# Patient Record
Sex: Male | Born: 1990 | Race: Black or African American | Hispanic: No | Marital: Single | Smoking: Never smoker
Health system: Southern US, Community
[De-identification: ages and names within clinical notes are randomized; demographics above are authoritative.]

## PROBLEM LIST (undated history)

## (undated) DIAGNOSIS — Z973 Presence of spectacles and contact lenses: Secondary | ICD-10-CM

## (undated) HISTORY — PX: TONSILLECTOMY: SUR1361

## (undated) HISTORY — DX: Presence of spectacles and contact lenses: Z97.3

---
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
## 2009-06-26 ENCOUNTER — Emergency Department (HOSPITAL_COMMUNITY): Admission: EM | Admit: 2009-06-26 | Discharge: 2009-06-26 | Payer: Self-pay | Admitting: Emergency Medicine

## 2012-06-07 ENCOUNTER — Encounter: Payer: Self-pay | Admitting: Family Medicine

## 2012-06-08 ENCOUNTER — Encounter: Payer: Self-pay | Admitting: Family Medicine

## 2012-06-08 ENCOUNTER — Ambulatory Visit (INDEPENDENT_AMBULATORY_CARE_PROVIDER_SITE_OTHER): Payer: BC Managed Care – PPO | Admitting: Family Medicine

## 2012-06-08 VITALS — BP 120/78 | HR 56 | Ht 74.5 in | Wt 178.0 lb

## 2012-06-08 DIAGNOSIS — J309 Allergic rhinitis, unspecified: Secondary | ICD-10-CM

## 2012-06-08 DIAGNOSIS — Z23 Encounter for immunization: Secondary | ICD-10-CM

## 2012-06-08 DIAGNOSIS — Z0000 Encounter for general adult medical examination without abnormal findings: Secondary | ICD-10-CM

## 2012-06-08 LAB — POCT URINALYSIS DIPSTICK
Blood, UA: NEGATIVE
Glucose, UA: NEGATIVE
Spec Grav, UA: 1.015
Urobilinogen, UA: NEGATIVE
pH, UA: 5

## 2012-06-08 NOTE — Progress Notes (Signed)
  Subjective:    Patient ID: Alexander Mayer, male    DOB: [birth year], 21 y.o.   MRN: [id number]  HPI He is here for examination prior to going to [facility] and to play basketball. 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 His past medical history is negative except for allergies. There is a questionable history of sickle cell trait however previous records have no data on this. He's had no difficulty with chest pain, he related problems, passing out. He has no questions or concerns. He does not smoke. He is sexually active and does use condoms.  Review of Systems Negative except as above    Objective:   Physical Exam alert and in no distress. Tympanic membranes and canals are normal. Throat is clear. Tonsils are normal. Neck is supple without adenopathy or thyromegaly. Cardiac exam shows a regular sinus rhythm without murmurs or gallops. Lungs are clear to auscultation. Abdominal exam shows no masses or tenderness. Genitalia normal with no hernia        Assessment & Plan:   1. Routine general medical examination at a health care facility  POCT Urinalysis Dipstick  2. Immunization due  Tdap vaccine greater than or equal to 7yo IM  3. Allergic rhinitis     sickle test ordered. I will be sent to history and her when we get it. I encouraged him to get tested for EIA.

## 2012-06-08 NOTE — Patient Instructions (Signed)
Call back with the fax number and we will send the sickle cell information. Talked to your trainer about getting tested for exercise-induced asthma

## 2012-06-08 NOTE — Addendum Note (Signed)
Addended by: Ronnald Nian on: 06/08/2012 12:20 PM   Modules accepted: Orders

## 2012-07-31 ENCOUNTER — Telehealth: Payer: Self-pay | Admitting: Family Medicine

## 2012-07-31 NOTE — Telephone Encounter (Signed)
FAX NUMBER SHOULD BE [phone] WILL RESEND DT

## 2012-07-31 NOTE — Telephone Encounter (Signed)
Pt called and wants [place] to refax sickle cell report to the college [phone] he will check and see if there is a different fax number and call us back

## 2013-05-06 ENCOUNTER — Emergency Department (HOSPITAL_COMMUNITY): Payer: BC Managed Care – PPO

## 2013-05-06 ENCOUNTER — Emergency Department (HOSPITAL_COMMUNITY)
Admission: EM | Admit: 2013-05-06 | Discharge: 2013-05-06 | Disposition: A | Discharge status: Home / Self Care | Payer: BC Managed Care – PPO | Attending: Emergency Medicine | Admitting: Emergency Medicine

## 2013-05-06 ENCOUNTER — Telehealth (HOSPITAL_COMMUNITY): Payer: Self-pay | Admitting: Emergency Medicine

## 2013-05-06 ENCOUNTER — Encounter (HOSPITAL_COMMUNITY): Payer: Self-pay | Admitting: Emergency Medicine

## 2013-05-06 DIAGNOSIS — R109 Unspecified abdominal pain: Secondary | ICD-10-CM | POA: Insufficient documentation

## 2013-05-06 DIAGNOSIS — K529 Noninfective gastroenteritis and colitis, unspecified: Secondary | ICD-10-CM

## 2013-05-06 DIAGNOSIS — R1031 Right lower quadrant pain: Secondary | ICD-10-CM

## 2013-05-06 DIAGNOSIS — K5289 Other specified noninfective gastroenteritis and colitis: Secondary | ICD-10-CM | POA: Insufficient documentation

## 2013-05-06 LAB — URINALYSIS, ROUTINE W REFLEX MICROSCOPIC
Glucose, UA: NEGATIVE mg/dL
Hgb urine dipstick: NEGATIVE
Ketones, ur: NEGATIVE mg/dL
Leukocytes, UA: NEGATIVE
pH: 5.5 (ref 5.0–8.0)

## 2013-05-06 LAB — CBC WITH DIFFERENTIAL/PLATELET
Basophils Absolute: 0 10*3/uL (ref 0.0–0.1)
Eosinophils Absolute: 0.1 10*3/uL (ref 0.0–0.7)
Eosinophils Relative: 1 % (ref 0–5)
HCT: 44.2 % (ref 39.0–52.0)
MCH: 27.4 pg (ref 26.0–34.0)
MCV: 81.7 fL (ref 78.0–100.0)
Monocytes Absolute: 1.3 10*3/uL — ABNORMAL HIGH (ref 0.1–1.0)
Platelets: 208 10*3/uL (ref 150–400)
RDW: 12.1 % (ref 11.5–15.5)

## 2013-05-06 LAB — LIPASE, BLOOD: Lipase: 13 U/L (ref 11–59)

## 2013-05-06 LAB — COMPREHENSIVE METABOLIC PANEL
ALT: 6 U/L (ref 0–53)
AST: 12 U/L (ref 0–37)
Albumin: 4.2 g/dL (ref 3.5–5.2)
Alkaline Phosphatase: 56 U/L (ref 39–117)
Chloride: 101 mEq/L (ref 96–112)
Potassium: 4 mEq/L (ref 3.5–5.1)
Sodium: 139 mEq/L (ref 135–145)
Total Bilirubin: 1 mg/dL (ref 0.3–1.2)

## 2013-05-06 MED ORDER — CIPROFLOXACIN HCL 500 MG PO TABS: 500.0000 mg | ORAL_TABLET | Freq: Two times a day (BID) | ORAL | Status: DC

## 2013-05-06 MED ORDER — IOHEXOL 300 MG/ML  SOLN
100.0000 mL | Freq: Once | INTRAMUSCULAR | Status: AC | PRN
  Administered 2013-05-06: 100 mL via INTRAVENOUS

## 2013-05-06 MED ORDER — SODIUM CHLORIDE 0.9 % IV BOLUS (SEPSIS)
1000.0000 mL | Freq: Once | INTRAVENOUS | Status: AC
  Administered 2013-05-06: 1000 mL via INTRAVENOUS

## 2013-05-06 MED ORDER — IOHEXOL 300 MG/ML  SOLN
50.0000 mL | Freq: Once | INTRAMUSCULAR | Status: AC | PRN
  Administered 2013-05-06: 50 mL via ORAL

## 2013-05-06 MED ORDER — METRONIDAZOLE 500 MG PO TABS: 500.0000 mg | ORAL_TABLET | Freq: Three times a day (TID) | ORAL | Status: DC

## 2013-05-06 NOTE — ED Notes (Signed)
Pt finished with contrast.

## 2013-05-06 NOTE — ED Notes (Signed)
Pt aware of the need for a urine sample, urinal at bedside. 

## 2013-05-06 NOTE — ED Notes (Signed)
Pt c/o right side lower abdominal pain, denies n/v/d.

## 2013-05-06 NOTE — ED Provider Notes (Signed)
   History    CSN: [id number] Arrival date & time 05/06/13  1005  First MD Initiated Contact with Patient 05/06/13 1016     Chief Complaint  Patient presents with  . Abdominal Pain   (Consider location/radiation/quality/duration/timing/severity/associated sxs/prior Treatment) HPI Comments: 22 yo male with no medical hx, no smoking, no etoh present with RLQ pain.  Started centrally yesterday and now is right lower flank.  FH kidney stones.  Constant.  Mild.  Nothing worsens.  Last meal yesterday.  Ache.    Patient is a 22 y.o. male presenting with abdominal pain. The history is provided by the patient.  Abdominal Pain Associated symptoms include abdominal pain. Pertinent negatives include no chest pain, no headaches and no shortness of breath.   History reviewed. No pertinent past medical history. History reviewed. No pertinent past surgical history. No family history on file. History  Substance Use Topics  . Smoking status: Never Smoker   . Smokeless tobacco: Never Used  . Alcohol Use: No    Review of Systems  Constitutional: Negative for fever and chills.  HENT: Negative for neck pain and neck stiffness.   Eyes: Negative for visual disturbance.  Respiratory: Negative for shortness of breath.   Cardiovascular: Negative for chest pain.  Gastrointestinal: Positive for abdominal pain. Negative for vomiting.  Genitourinary: Positive for flank pain. Negative for dysuria.  Musculoskeletal: Negative for back pain.  Skin: Negative for rash.  Neurological: Negative for light-headedness and headaches.    Allergies  Review of patient's allergies indicates no known allergies.  Home Medications  No current outpatient prescriptions on file. BP 140/71  Pulse 71  Temp(Src) 98.8 F (37.1 C) (Oral)  Resp 20  SpO2 99% Physical Exam  Nursing note and vitals reviewed. Constitutional: He is oriented to person, place, and time. He appears well-developed and well-nourished.  HENT:   Head: Normocephalic and atraumatic.  Eyes: Conjunctivae are normal. Right eye exhibits no discharge. Left eye exhibits no discharge.  Neck: Normal range of motion. Neck supple. No tracheal deviation present.  Cardiovascular: Normal rate and regular rhythm.   Pulmonary/Chest: Effort normal and breath sounds normal.  Abdominal: Soft. He exhibits no distension. There is tenderness (mild right lower flank/ RLQ). There is no guarding.  Musculoskeletal: He exhibits no edema.  Neurological: He is alert and oriented to person, place, and time.  Skin: Skin is warm. No rash noted.  Psychiatric: He has a normal mood and affect.    ED Course  Procedures (including critical care time) Labs Reviewed  URINALYSIS, ROUTINE W REFLEX MICROSCOPIC  COMPREHENSIVE METABOLIC PANEL  CBC WITH DIFFERENTIAL  LIPASE, BLOOD   No results found. No diagnosis found.  MDM  Emergency Focused Ultrasound Exam: Limited abdomen of kidneys and bladder Indication: flank pain Focused abdominal ultrasound with kidneys imaged in transverse and longitudinal planes with bladder visualized in transverse plane. Interpretation: no hydronephrosis visualized.  No stones visualized  Images stored on the machine.  No hydro on [place] limited.  Concern for early appy.  CT ordered . NPO. No results found. Spoke with radiology as read not going through.  Normal appendix, focal colitis, no other acute findings. Pt comfortable on recheck.  Abx and close fup oupt.  DC  Enid Skeens, MD 05/06/13 [phone]

## 2013-11-26 ENCOUNTER — Telehealth: Payer: Self-pay | Admitting: Family Medicine

## 2013-11-26 NOTE — Telephone Encounter (Signed)
Request for TDAP records to [phone] college

## 2013-12-17 ENCOUNTER — Ambulatory Visit (INDEPENDENT_AMBULATORY_CARE_PROVIDER_SITE_OTHER): Payer: BC Managed Care – PPO | Admitting: Family Medicine

## 2013-12-17 ENCOUNTER — Encounter: Payer: Self-pay | Admitting: Family Medicine

## 2013-12-17 VITALS — BP 150/82 | HR 80 | Ht 76.0 in | Wt 193.0 lb

## 2013-12-17 DIAGNOSIS — M940 Chondrocostal junction syndrome [Tietze]: Secondary | ICD-10-CM

## 2013-12-17 NOTE — Patient Instructions (Signed)
You get the prescription filled or use Naprosyn over-the-counter 2 pills twice a day

## 2013-12-17 NOTE — Progress Notes (Signed)
   Subjective:    Patient ID: Alexander Mayer, male    DOB: 04/21/1991, 23 y.o.   MRN: [id number]  HPI 2 weeks ago he finished a work out and when he went home and stretched he felt a pain in his right anterior rib area and did radiate to his back. He noted deep breathing and right lateral motion causes increased pain since then the pain has diminished to the point where when he wakes up in the morning he has no pain but as the day goes on he notes more difficulty. He also notes that when he drives in a car and slouches, he will have more discomfort and when he sits up the pain will go way. He has been seen in an urgent care Center as well as in the emergency room and told this was costochondritis.   Review of Systems     Objective:   Physical Exam Alert and in no distress. Cardiac exam shows regular rhythm without murmurs or callus. Lungs are clear to auscultation. Slight tenderness palpation over the lower costal area in the right medial section.       Assessment & Plan:  Costochondritis  I explained that this is not technically costochondritis but is essentially same idea since his down over the chondral bones at the lower rib margin. Recommend he use anti-inflammatories regularly

## 2014-10-24 ENCOUNTER — Encounter: Payer: Self-pay | Admitting: Medical

## 2014-10-24 ENCOUNTER — Ambulatory Visit (INDEPENDENT_AMBULATORY_CARE_PROVIDER_SITE_OTHER): Payer: BLUE CROSS/BLUE SHIELD | Admitting: Medical

## 2014-10-24 VITALS — BP 120/70 | HR 60 | Temp 98.2°F | Resp 16 | Ht 76.0 in | Wt 191.0 lb

## 2014-10-24 DIAGNOSIS — Z113 Encounter for screening for infections with a predominantly sexual mode of transmission: Secondary | ICD-10-CM

## 2014-10-24 DIAGNOSIS — D573 Sickle-cell trait: Secondary | ICD-10-CM

## 2014-10-24 DIAGNOSIS — Z13220 Encounter for screening for lipoid disorders: Secondary | ICD-10-CM

## 2014-10-24 DIAGNOSIS — Z0000 Encounter for general adult medical examination without abnormal findings: Secondary | ICD-10-CM

## 2014-10-24 LAB — POCT URINALYSIS DIPSTICK
BILIRUBIN UA: NEGATIVE
Blood, UA: NEGATIVE
GLUCOSE UA: NEGATIVE
KETONES UA: NEGATIVE
LEUKOCYTES UA: NEGATIVE
NITRITE UA: NEGATIVE
PH UA: 7
Protein, UA: NEGATIVE
Spec Grav, UA: 1.02
Urobilinogen, UA: 1

## 2014-10-24 LAB — LIPID PANEL
CHOL/HDL RATIO: 4.3 ratio
Cholesterol: 218 mg/dL — ABNORMAL HIGH (ref 0–200)
HDL: 51 mg/dL (ref >39)
LDL Cholesterol: 153 mg/dL — ABNORMAL HIGH (ref 0–99)
Triglycerides: 71 mg/dL (ref <150)
VLDL: 14 mg/dL (ref 0–40)

## 2014-10-24 NOTE — Progress Notes (Signed)
Subjective:   HPI  Alexander Mayer is a 24 y.o. male who presents for a complete physical.   Preventative care: Last ophthalmology visit:2015 Last dental visit: 2 years ago [address] Last colonoscopy:no Last prostate exam: no Last EKG:no Last labs:never  Prior vaccinations: TD or Tdap:needs today Influenza:declines  Concerns: None major.    Reviewed their medical, surgical, family, social, medication, and allergy history and updated chart as appropriate.  Past Medical History  Diagnosis Date  . Wears glasses     Past Surgical History  Procedure Laterality Date  . Tonsillectomy      History   Social History  . Marital Status: Single    Spouse Name: N/A    Number of Children: N/A  . Years of Education: N/A   Occupational History  . Not on file.   Social History Main Topics  . Smoking status: Never Smoker   . Smokeless tobacco: Never Used  . Alcohol Use: No  . Drug Use: No  . Sexual Activity: Yes    Birth Control/ Protection: Condom   Other Topics Concern  . Not on file   Social History Narrative   [employer] in [place], [place], criminal justice, senior year as of 10/24/2014.  Single, no children, exercise - basketball, runs.   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  Was working at [employer] prior.    Family History  Problem Relation Age of Onset  . Cancer Maternal Aunt     breast  . Diabetes Maternal Uncle   . Heart disease Neg Hx   . Hypertension Neg Hx   . Stroke Maternal Aunt     No current outpatient prescriptions on file.  No Known Allergies   Review of Systems Constitutional: -fever, -chills, -sweats, -unexpected weight change, -decreased appetite, -fatigue Allergy: -sneezing, -itching, -congestion Dermatology: -changing moles, --rash, -lumps ENT: -runny nose, -ear pain, -sore throat, -hoarseness, -sinus pain, -teeth pain, - ringing in ears, -hearing loss, -nosebleeds Cardiology: -chest pain,  -palpitations, -swelling, -difficulty breathing when lying flat, -waking up short of breath Respiratory: -cough, -shortness of breath, -difficulty breathing with exercise or exertion, -wheezing, -coughing up blood Gastroenterology: +abdominal pain, -nausea, -vomiting, -diarrhea, -constipation, -blood in stool, -changes in bowel movement, -difficulty swallowing or eating Hematology: -bleeding, -bruising  Musculoskeletal: -joint aches, -muscle aches, -joint swelling, -back pain, -neck pain, -cramping, -changes in gait Ophthalmology: denies vision changes, eye redness, itching, discharge Urology: -burning with urination, -difficulty urinating, -blood in urine, -urinary frequency, -urgency, -incontinence Neurology: -headache, -weakness, -tingling, -numbness, -memory loss, -falls, -dizziness Psychology: -depressed mood, -agitation, -sleep problems     Objective:   Physical Exam  BP 120/70 mmHg  Pulse 60  Temp(Src) 98.2 F (36.8 C) (Oral)  Resp 16  Ht  (1.93 m)  Wt 191 lb (86.637 kg)  BMI 23.26 kg/m2  General appearance: alert, no distress, WD/WN, tall AA male Skin: few scattered macules, no worrisome lesions HEENT: normocephalic, conjunctiva/corneas normal, sclerae anicteric, PERRLA, EOMi, nares patent, no discharge or erythema, pharynx normal Oral cavity: MMM, tongue normal, teeth normal Neck: supple, no lymphadenopathy, no thyromegaly, no masses, normal ROM, no bruits Chest: non tender, normal shape and expansion Heart: RRR, normal S1, S2, no murmurs Lungs: CTA bilaterally, no wheezes, rhonchi, or rales Abdomen: +bs, soft, non tender, non distended, no masses, no hepatomegaly, no splenomegaly, no bruits Back: non tender, normal ROM, no scoliosis Musculoskeletal: upper extremities non tender, no obvious deformity, normal ROM throughout, lower extremities non tender, no obvious  deformity, normal ROM throughout Extremities: no edema, no cyanosis, no clubbing Pulses: 2+ symmetric,  upper and lower extremities, normal cap refill Neurological: alert, oriented x 3, CN2-12 intact, strength normal upper extremities and lower extremities, sensation normal throughout, DTRs 2+ throughout, no cerebellar signs, gait normal Psychiatric: normal affect, behavior normal, pleasant  GU: normal male external genitalia, circumcised, nontender, no masses, no hernia, no lymphadenopathy Rectal: deferred   Assessment and Plan :    Encounter Diagnoses  Name Primary?  . Encounter for health maintenance examination in adult Yes  . Screen for STD (sexually transmitted disease)   . Screening for lipid disorders   . Sickle cell trait    Physical exam - discussed healthy lifestyle, diet, exercise, preventative care, vaccinations, and addressed their concerns.   Discussed diagnosis of sickle trait. STD screening today, discussed safe sex Lipid screen today See your eye doctor yearly for routine vision care. See your dentist yearly for routine dental care including hygiene visits twice yearly. Follow-up pending labs

## 2014-10-24 NOTE — Addendum Note (Signed)
Addended by: Lilli LightLOMAX, Ondrea Dow G on: 10/24/2014 03:44 PM   Modules accepted: Orders

## 2014-10-25 LAB — RPR

## 2014-10-25 LAB — HIV ANTIBODY (ROUTINE TESTING W REFLEX): HIV: NONREACTIVE

## 2014-10-26 LAB — GC/CHLAMYDIA PROBE AMP
CT Probe RNA: NEGATIVE
GC Probe RNA: NEGATIVE

## 2015-02-27 ENCOUNTER — Emergency Department (HOSPITAL_COMMUNITY)
Admission: EM | Admit: 2015-02-27 | Discharge: 2015-02-27 | Disposition: A | Discharge status: Home / Self Care | Payer: BLUE CROSS/BLUE SHIELD | Attending: Emergency Medicine | Admitting: Emergency Medicine

## 2015-02-27 ENCOUNTER — Encounter (HOSPITAL_COMMUNITY): Payer: Self-pay | Admitting: Emergency Medicine

## 2015-02-27 ENCOUNTER — Emergency Department (HOSPITAL_COMMUNITY): Payer: BLUE CROSS/BLUE SHIELD

## 2015-02-27 DIAGNOSIS — R0789 Other chest pain: Secondary | ICD-10-CM | POA: Insufficient documentation

## 2015-02-27 DIAGNOSIS — R079 Chest pain, unspecified: Secondary | ICD-10-CM

## 2015-02-27 DIAGNOSIS — R109 Unspecified abdominal pain: Secondary | ICD-10-CM | POA: Insufficient documentation

## 2015-02-27 DIAGNOSIS — M549 Dorsalgia, unspecified: Secondary | ICD-10-CM | POA: Insufficient documentation

## 2015-02-27 MED ORDER — TRAMADOL HCL 50 MG PO TABS: 50.0000 mg | ORAL_TABLET | Freq: Four times a day (QID) | ORAL | Status: AC | PRN

## 2015-02-27 NOTE — ED Notes (Signed)
Pt is in stable condition upon d/c and ambulates from ED. 

## 2015-02-27 NOTE — Discharge Instructions (Signed)

## 2015-02-27 NOTE — ED Notes (Signed)
PT reports that he has RUQ pain that radiates to back; has been working out playing BB but nothing he thinks is strange. No nausea, vomiting, diarr, SOB, diaphoresis. Been going on for 2 weeks; gets better with lying down and ibuprofen.

## 2015-02-27 NOTE — ED Provider Notes (Signed)
CSN: [id number]     Arrival date & time 02/27/15  1030 History   First MD Initiated Contact with Patient 02/27/15 1035     Chief Complaint  Patient presents with  . Back Pain  . Abdominal Pain     (Consider location/radiation/quality/duration/timing/severity/associated sxs/prior Treatment) HPI Comments: Patient presents to the emergency department for evaluation of pain on the right side of his ribs. Patient reports the symptoms have been ongoing for 2 weeks. He reports that the pain is intermittent. He has not identified any factors that cause the pain. It does get better when he takes ibuprofen or lies down, however. Patient denies any injury to the area. There is no associated shortness of breath. No nausea, vomiting, diarrhea or constipation. He has not identified any rash.  Patient is a 24 y.o. male presenting with back pain and abdominal pain.  Back Pain Associated symptoms: abdominal pain and chest pain   Abdominal Pain Associated symptoms: chest pain     Past Medical History  Diagnosis Date  . Wears glasses    Past Surgical History  Procedure Laterality Date  . Tonsillectomy     Family History  Problem Relation Age of Onset  . Cancer Maternal Aunt     breast  . Diabetes Maternal Uncle   . Heart disease Neg Hx   . Hypertension Neg Hx   . Stroke Maternal Aunt    History  Substance Use Topics  . Smoking status: Never Smoker   . Smokeless tobacco: Never Used  . Alcohol Use: No    Review of Systems  Cardiovascular: Positive for chest pain.  Gastrointestinal: Positive for abdominal pain.  Musculoskeletal: Positive for back pain.  All other systems reviewed and are negative.     Allergies  Review of patient's allergies indicates no known allergies.  Home Medications   Prior to Admission medications   Not on File   BP 129/76 mmHg  Pulse 95  Temp(Src) 97.4 F (36.3 C) (Oral)  Resp 16  SpO2 100% Physical Exam  Constitutional: He is oriented to person,  place, and time. He appears well-developed and well-nourished. No distress.  HENT:  Head: Normocephalic and atraumatic.  Right Ear: Hearing normal.  Left Ear: Hearing normal.  Nose: Nose normal.  Mouth/Throat: Oropharynx is clear and moist and mucous membranes are normal.  Eyes: Conjunctivae and EOM are normal. Pupils are equal, round, and reactive to light.  Neck: Normal range of motion. Neck supple.  Cardiovascular: Regular rhythm, S1 normal and S2 normal.  Exam reveals no gallop and no friction rub.   No murmur heard. Pulmonary/Chest: Effort normal and breath sounds normal. No respiratory distress. He exhibits no tenderness.  Abdominal: Soft. Normal appearance and bowel sounds are normal. There is no hepatosplenomegaly. There is no tenderness. There is no rebound, no guarding, no tenderness at McBurney's point and negative Murphy's sign. No hernia.  Musculoskeletal: Normal range of motion.  Neurological: He is alert and oriented to person, place, and time. He has normal strength. No cranial nerve deficit or sensory deficit. Coordination normal. GCS eye subscore is 4. GCS verbal subscore is 5. GCS motor subscore is 6.  Skin: Skin is warm, dry and intact. No rash noted. No cyanosis.  Psychiatric: He has a normal mood and affect. His speech is normal and behavior is normal. Thought content normal.  Nursing note and vitals reviewed.   ED Course  Procedures (including critical care time) Labs Review Labs Reviewed - No data to display  Imaging  Review No results found.   EKG Interpretation None      MDM   Final diagnoses:  Chest pain    Patient presents with complaints of pain in the right side of his chest and abdomen area that has been intermittent for 2 weeks. Patient indicates the region of his right anterior costal margin as the area where the pain occurs. There is no tenderness or crepitance at this time. No overlying skin changes. Deep palpation in the right upper quadrant  does not elicit any pain. No concern for liver or gallbladder disease. Patient's lungs are clear. He does not have shortness of breath. PERC neg for PE consideration. Chest x-ray performed, no acute pathology noted. Patient is very active, plays basketball. No known injury, but this is likely inflammatory. Will treat with rest, NSAIDs as needed.    Gilda Creasehristopher J Pollina, MD 02/27/15 1112

## 2015-03-05 ENCOUNTER — Telehealth: Payer: Self-pay | Admitting: Family Medicine

## 2015-03-05 NOTE — Telephone Encounter (Signed)
ER letter sent 

## 2016-04-13 ENCOUNTER — Emergency Department (HOSPITAL_COMMUNITY)
Admission: EM | Admit: 2016-04-13 | Discharge: 2016-04-13 | Disposition: A | Discharge status: Home / Self Care | Payer: BLUE CROSS/BLUE SHIELD | Attending: Emergency Medicine | Admitting: Emergency Medicine

## 2016-04-13 ENCOUNTER — Encounter (HOSPITAL_COMMUNITY): Payer: Self-pay

## 2016-04-13 DIAGNOSIS — R590 Localized enlarged lymph nodes: Secondary | ICD-10-CM | POA: Insufficient documentation

## 2016-04-13 DIAGNOSIS — J029 Acute pharyngitis, unspecified: Secondary | ICD-10-CM | POA: Insufficient documentation

## 2016-04-13 LAB — RAPID STREP SCREEN (MED CTR MEBANE ONLY): Streptococcus, Group A Screen (Direct): NEGATIVE

## 2016-04-13 MED ORDER — DEXAMETHASONE 4 MG PO TABS
10.0000 mg | ORAL_TABLET | Freq: Once | ORAL | Status: AC
  Administered 2016-04-13: 10 mg via ORAL
  Filled 2016-04-13: qty 3

## 2016-04-13 MED ORDER — PENICILLIN G BENZATHINE 1200000 UNIT/2ML IM SUSP
1.2000 10*6.[IU] | Freq: Once | INTRAMUSCULAR | Status: AC
  Administered 2016-04-13: 1.2 10*6.[IU] via INTRAMUSCULAR
  Filled 2016-04-13: qty 2

## 2016-04-13 NOTE — ED Provider Notes (Signed)
CSN: [id number]     Arrival date & time 04/13/16  1026 History  By signing my name below, I, Ronney LionSuzanne Le, attest that this documentation has been prepared under the direction and in the presence of Newell RubbermaidJeffrey Arien Benincasa, PA-C. Electronically Signed: Ronney LionSuzanne Le, ED Scribe. 04/13/2016. 11:28 AM.    Chief Complaint  Patient presents with  . Sore Throat  . Generalized Body Aches   The history is provided by the patient. No language interpreter was used.   HPI Comments: Alexander Mayer is a 25 y.o. male with a history of tonsillectomy, who presents to the Emergency Department complaining of a gradual-onset, constant, 8/10 sore throat that began 3 days ago. Patient also complains of associated generalized myalgias, chills, and a fever with a Tmax of 104 at home when he had taken his temperature 2 days ago; he states his temperature was about 101 when he took it this morning. Patient states he is able to tolerate fluids, but he has increased pain with swallowing. He denies cough. He also denies a history of smoking.  Past Medical History  Diagnosis Date  . Wears glasses    Past Surgical History  Procedure Laterality Date  . Tonsillectomy     Family History  Problem Relation Age of Onset  . Cancer Maternal Aunt     breast  . Diabetes Maternal Uncle   . Heart disease Neg Hx   . Hypertension Neg Hx   . Stroke Maternal Aunt    Social History  Substance Use Topics  . Smoking status: Never Smoker   . Smokeless tobacco: Never Used  . Alcohol Use: No    Review of Systems  Constitutional: Positive for fever and chills.  HENT: Positive for sore throat.   Respiratory: Negative for cough.   Musculoskeletal: Positive for myalgias (generalized).      Allergies  Review of patient's allergies indicates no known allergies.  Home Medications   Prior to Admission medications   Medication Sig Start Date End Date Taking? Authorizing Provider  LORATADINE PO Take 10 mg by mouth daily.    Historical  Provider, MD  traMADol (ULTRAM) 50 MG tablet Take 1 tablet (50 mg total) by mouth every 6 (six) hours as needed. 02/27/15   Gilda Creasehristopher J Pollina, MD   BP 133/82 mmHg  Pulse 81  Temp(Src) 98.9 F (37.2 C) (Oral)  Resp 16  Ht 6\' 4"  (1.93 m)  Wt 86.183 kg  BMI 23.14 kg/m2  SpO2 100% Physical Exam  Constitutional: He is oriented to person, place, and time. He appears well-developed and well-nourished. No distress.  HENT:  Head: Normocephalic and atraumatic.  You've he is midline and rises pronation, tonsils surgically absent, mild erythema noted to the posterior oropharynx, no signs of RTA, PTA. No pooling of secretions  Eyes: Conjunctivae and EOM are normal.  Neck: Neck supple. No tracheal deviation present.  Tender bilateral cervical lymphadenopathy  Cardiovascular: Normal rate, regular rhythm and normal heart sounds.   Pulmonary/Chest: Effort normal and breath sounds normal. No respiratory distress. He has no wheezes. He has no rales.  Lungs are clear to auscultation.   Musculoskeletal: Normal range of motion.  Neurological: He is alert and oriented to person, place, and time.  Skin: Skin is warm and dry.  Psychiatric: He has a normal mood and affect. His behavior is normal.  Nursing note and vitals reviewed.   ED Course  Procedures (including critical care time)  DIAGNOSTIC STUDIES: Oxygen Saturation is 99% on RA, normal by  my interpretation.    COORDINATION OF CARE: 11:05 AM - Discussed treatment plan with pt at bedside which includes awaiting strep screen results. However, given that pt meets [facility] criteria (fever, tender anterior cervical lymphadenopathy, and absence of cough),  discussed with pt that we will probably treat him for strep with penicillin injection and Decadron even if the rapid strep screen is negative. Strict return precautions given. Pt verbalized understanding and agreed to plan.   Labs Review Labs Reviewed  RAPID STREP SCREEN (NOT AT [facility])  CULTURE,  GROUP A STREP [facility])   I have personally reviewed and evaluated these lab results as part of my medical decision-making.  MDM   Final diagnoses:  Pharyngitis   Labs: Rapid Strep Screen (negative, sent for culture)  Imaging:  Consults:  Therapeutics: Decadron tablet 10 mg, penicillin g benzathine injection (1,200,000 units)  Discharge Meds:   Assessment/Plan:  Pt with hx of tonsillectomy, febrile with cervical lymphadenopathy & dysphagia; Likely bacterial pharyngitis. Although patient strep was negative, clinical presentation consistent with infectious etiology. Treated in the Ed with steroids, NSAIDs, Pain medication and PCN IM. Marland Kitchen. Presentation non concerning for PTA or infxn spread to soft tissue. No trismus or uvula deviation. Specific return precautions discussed. Pt able to drink water in ED without difficulty with intact air way. Recommended PCP follow up.      I personally performed the services described in this documentation, which was scribed in my presence. The recorded information has been reviewed and is accurate.     Eyvonne MechanicJeffrey Demaris Leavell, PA-C 04/13/16 1206  Doug SouSam Jacubowitz, MD 04/13/16 [phone]

## 2016-04-13 NOTE — Discharge Instructions (Signed)
Please follow-up with your primary care provider if symptoms persist, return immediately if any new or worsening signs or symptoms present.  Pharyngitis Pharyngitis is redness, pain, and swelling (inflammation) of your pharynx.  CAUSES  Pharyngitis is usually caused by infection. Most of the time, these infections are from viruses (viral) and are part of a cold. However, sometimes pharyngitis is caused by bacteria (bacterial). Pharyngitis can also be caused by allergies. Viral pharyngitis may be spread from person to person by coughing, sneezing, and personal items or utensils (cups, forks, spoons, toothbrushes). Bacterial pharyngitis may be spread from person to person by more intimate contact, such as kissing.  SIGNS AND SYMPTOMS  Symptoms of pharyngitis include:   Sore throat.   Tiredness (fatigue).   Low-grade fever.   Headache.  Joint pain and muscle aches.  Skin rashes.  Swollen lymph nodes.  Plaque-like film on throat or tonsils (often seen with bacterial pharyngitis). DIAGNOSIS  Your health care provider will ask you questions about your illness and your symptoms. Your medical history, along with a physical exam, is often all that is needed to diagnose pharyngitis. Sometimes, a rapid strep test is done. Other lab tests may also be done, depending on the suspected cause.  TREATMENT  Viral pharyngitis will usually get better in 3-4 days without the use of medicine. Bacterial pharyngitis is treated with medicines that kill germs (antibiotics).  HOME CARE INSTRUCTIONS   Drink enough water and fluids to keep your urine clear or pale yellow.   Only take over-the-counter or prescription medicines as directed by your health care provider:   If you are prescribed antibiotics, make sure you finish them even if you start to feel better.   Do not take aspirin.   Get lots of rest.   Gargle with 8 oz of salt water ( tsp of salt per 1 qt of water) as often as every 1-2 hours  to soothe your throat.   Throat lozenges (if you are not at risk for choking) or sprays may be used to soothe your throat. SEEK MEDICAL CARE IF:   You have large, tender lumps in your neck.  You have a rash.  You cough up green, yellow-Trapani, or bloody spit. SEEK IMMEDIATE MEDICAL CARE IF:   Your neck becomes stiff.  You drool or are unable to swallow liquids.  You vomit or are unable to keep medicines or liquids down.  You have severe pain that does not go away with the use of recommended medicines.  You have trouble breathing (not caused by a stuffy nose). MAKE SURE YOU:   Understand these instructions.  Will watch your condition.  Will get help right away if you are not doing well or get worse.   This information is not intended to replace advice given to you by your health care provider. Make sure you discuss any questions you have with your health care provider.   Document Released: 10/04/2005 Document Revised: 07/25/2013 Document Reviewed: 06/11/2013 Elsevier Interactive Patient Education [employer]2016 Elsevier Inc.

## 2016-04-13 NOTE — ED Notes (Signed)
ABOVE CHARTED IN ERROR.

## 2016-04-13 NOTE — ED Notes (Addendum)
ERROR

## 2016-04-13 NOTE — ED Notes (Signed)
C/o sore throat and bodyaches x 3 days. States had temp of 104 on Sunday. No exudate noted in throat.

## 2016-04-13 NOTE — ED Notes (Signed)
Patient complains of sore throat, body aches and chills that started Sunday, NAD

## 2016-04-15 ENCOUNTER — Emergency Department (HOSPITAL_COMMUNITY)
Admission: EM | Admit: 2016-04-15 | Discharge: 2016-04-16 | Disposition: A | Discharge status: Home / Self Care | Payer: BLUE CROSS/BLUE SHIELD | Attending: Emergency Medicine | Admitting: Emergency Medicine

## 2016-04-15 ENCOUNTER — Encounter (HOSPITAL_COMMUNITY): Payer: Self-pay

## 2016-04-15 DIAGNOSIS — J028 Acute pharyngitis due to other specified organisms: Secondary | ICD-10-CM

## 2016-04-15 DIAGNOSIS — B9789 Other viral agents as the cause of diseases classified elsewhere: Secondary | ICD-10-CM

## 2016-04-15 DIAGNOSIS — B349 Viral infection, unspecified: Secondary | ICD-10-CM | POA: Insufficient documentation

## 2016-04-15 LAB — CBC WITH DIFFERENTIAL/PLATELET
Basophils Absolute: 0 10*3/uL (ref 0.0–0.1)
Basophils Relative: 0 %
Eosinophils Absolute: 0 10*3/uL (ref 0.0–0.7)
Eosinophils Relative: 0 %
HEMATOCRIT: 43.3 % (ref 39.0–52.0)
HEMOGLOBIN: 13.9 g/dL (ref 13.0–17.0)
LYMPHS ABS: 0.8 10*3/uL (ref 0.7–4.0)
LYMPHS PCT: 14 %
MCH: 26.8 pg (ref 26.0–34.0)
MCHC: 32.1 g/dL (ref 30.0–36.0)
MCV: 83.6 fL (ref 78.0–100.0)
MONO ABS: 1.4 10*3/uL — AB (ref 0.1–1.0)
MONOS PCT: 23 %
NEUTROS ABS: 3.7 10*3/uL (ref 1.7–7.7)
NEUTROS PCT: 63 %
Platelets: 180 10*3/uL (ref 150–400)
RBC: 5.18 MIL/uL (ref 4.22–5.81)
RDW: 11.7 % (ref 11.5–15.5)
WBC: 6 10*3/uL (ref 4.0–10.5)

## 2016-04-15 LAB — BASIC METABOLIC PANEL
ANION GAP: 6 (ref 5–15)
BUN: 11 mg/dL (ref 6–20)
CALCIUM: 8.7 mg/dL — AB (ref 8.9–10.3)
CHLORIDE: 101 mmol/L (ref 101–111)
CO2: 28 mmol/L (ref 22–32)
Creatinine, Ser: 1.17 mg/dL (ref 0.61–1.24)
GFR calc non Af Amer: >60 mL/min (ref >60)
GLUCOSE: 99 mg/dL (ref 65–99)
Potassium: 3.9 mmol/L (ref 3.5–5.1)
Sodium: 135 mmol/L (ref 135–145)

## 2016-04-15 LAB — MONONUCLEOSIS SCREEN: Mono Screen: NEGATIVE

## 2016-04-15 MED ORDER — IBUPROFEN 400 MG PO TABS
800.0000 mg | ORAL_TABLET | Freq: Once | ORAL | Status: AC
  Administered 2016-04-15: 800 mg via ORAL
  Filled 2016-04-15: qty 2

## 2016-04-15 MED ORDER — ACETAMINOPHEN 500 MG PO TABS
1000.0000 mg | ORAL_TABLET | Freq: Once | ORAL | Status: AC
Start: 2016-04-15 — End: 2016-04-15
  Administered 2016-04-15: 1000 mg via ORAL
  Filled 2016-04-15: qty 2

## 2016-04-15 NOTE — ED Provider Notes (Signed)
CSN: [id number]     Arrival date & time 04/15/16  1959 History  By signing my name below, I, Placido SouLogan Joldersma, attest that this documentation has been prepared under the direction and in the presence of Everlene FarrierWilliam Mayda Shippee, PA-C. Electronically Signed: Placido SouLogan Joldersma, ED Scribe. 04/15/2016. 10:01 PM.   Chief Complaint  Patient presents with  . Sore Throat   The history is provided by the patient. No language interpreter was used.    HPI Comments: Alexander Mayer is a 25 y.o. male with a SHx including tonsillectomy who presents to the Emergency Department complaining of constant, moderate, left sided sore throat x 4 days. Pt [facility] 04/13/2016 for sore throat, had a strep test performed which was negative, was dx with pharyngitis and treated in the ED with steroids, NSAIDs, pain medication and PCN IM. Pt states that he felt better the next day before his symptoms returned. He now reports associated, moderate, fever (102.2 F in triage), sore throat and diffuse body aches. He has taken Motrin every 4-6 hours without significant relief. His throat pain worsens when swallowing. He denies cough, SOB, abd pain, n/v/d, and difficulty swallowing.   Past Medical History  Diagnosis Date  . Wears glasses    Past Surgical History  Procedure Laterality Date  . Tonsillectomy     Family History  Problem Relation Age of Onset  . Cancer Maternal Aunt     breast  . Diabetes Maternal Uncle   . Heart disease Neg Hx   . Hypertension Neg Hx   . Stroke Maternal Aunt    Social History  Substance Use Topics  . Smoking status: Never Smoker   . Smokeless tobacco: Never Used  . Alcohol Use: No    Review of Systems  Constitutional: Positive for fever, chills and fatigue.  HENT: Positive for sore throat. Negative for congestion, ear discharge, ear pain, rhinorrhea and trouble swallowing.   Eyes: Negative for visual disturbance.  Respiratory: Negative for cough and shortness of breath.    Cardiovascular: Negative for chest pain.  Gastrointestinal: Negative for nausea, vomiting, abdominal pain and diarrhea.  Genitourinary: Negative for dysuria.  Musculoskeletal: Positive for myalgias. Negative for neck pain and neck stiffness.  Neurological: Negative for light-headedness and headaches.    Allergies  Review of patient's allergies indicates no known allergies.  Home Medications   Prior to Admission medications   Medication Sig Start Date End Date Taking? Authorizing Provider  ibuprofen (ADVIL,MOTRIN) 200 MG tablet Take 200 mg by mouth every 6 (six) hours as needed for moderate pain.   Yes Historical Provider, MD  acetaminophen (TYLENOL) 325 MG tablet Take 2 tablets (650 mg total) by mouth every 6 (six) hours as needed. 04/16/16   Everlene FarrierWilliam Alfie Alderfer, PA-C  naproxen (NAPROSYN) 500 MG tablet Take 1 tablet (500 mg total) by mouth 2 (two) times daily with a meal. 04/16/16   Everlene FarrierWilliam Monterius Rolf, PA-C  traMADol (ULTRAM) 50 MG tablet Take 1 tablet (50 mg total) by mouth every 6 (six) hours as needed. Patient not taking: Reported on 04/15/2016 02/27/15   Gilda Creasehristopher J Pollina, MD   BP 116/65 mmHg  Pulse 74  Temp(Src) 99 F (37.2 C) (Oral)  Resp 16  SpO2 99%    Physical Exam  Constitutional: He is oriented to person, place, and time. He appears well-developed and well-nourished. No distress.  Nontoxic appearing.  HENT:  Head: Normocephalic and atraumatic.  Right Ear: Tympanic membrane and external ear normal.  Left Ear: Tympanic  membrane and external ear normal.  Mouth/Throat: Oropharynx is clear and moist. No oropharyngeal exudate.  tonsils surgically absent; uvula midline without edema; no trismus; no drooling; no stridor; no peritonsillar abscess. Bilateral tympanic membranes are pearly-gray without erythema or loss of landmarks.   Eyes: Conjunctivae are normal. Pupils are equal, round, and reactive to light. Right eye exhibits no discharge. Left eye exhibits no discharge.  Neck:  Normal range of motion. Neck supple. No JVD present. No tracheal deviation present.  Cardiovascular: Normal rate, regular rhythm, normal heart sounds and intact distal pulses.   Pulmonary/Chest: Effort normal and breath sounds normal. No stridor. No respiratory distress. He has no wheezes. He has no rales.  Lungs are clear to auscultation bilaterally.  Abdominal: Soft. Bowel sounds are normal. He exhibits no mass. There is no tenderness. There is no guarding.  No splenomegaly. Abdomen is soft and nontender to palpation.  Musculoskeletal: He exhibits no tenderness.  Lymphadenopathy:    He has no cervical adenopathy.  Neurological: He is alert and oriented to person, place, and time. Coordination normal.  Skin: Skin is warm and dry. No rash noted. He is not diaphoretic. No erythema. No pallor.  Psychiatric: He has a normal mood and affect. His behavior is normal.  Nursing note and vitals reviewed.   ED Course  Procedures  DIAGNOSTIC STUDIES: Oxygen Saturation is 100% on RA, normal by my interpretation.    COORDINATION OF CARE: 9:59 PM Discussed next steps with pt. Pt verbalized understanding and is agreeable with the plan.   Labs Review Labs Reviewed  BASIC METABOLIC PANEL - Abnormal; Notable for the following:    Calcium 8.7 (*)    All other components within normal limits  CBC WITH DIFFERENTIAL/PLATELET - Abnormal; Notable for the following:    Monocytes Absolute 1.4 (*)    All other components within normal limits  MONONUCLEOSIS SCREEN    Imaging Review No results found. I have personally reviewed and evaluated these lab results as part of my medical decision-making.   EKG Interpretation None     Filed Vitals:   04/15/16 2052 04/15/16 2055 04/15/16 2242 04/15/16 2344  BP:  165/90 121/77 116/65  Pulse:   80 74  Temp:  102.2 F (39 C) 101 F (38.3 C) 99 F (37.2 C)  TempSrc:  Oral Oral Oral  Resp:  16 18 16   SpO2: 100% 100% 100% 99%    MDM   Meds given in  ED:  Medications  acetaminophen (TYLENOL) tablet 1,000 mg (1,000 mg Oral Given 04/15/16 2122)  ibuprofen (ADVIL,MOTRIN) tablet 800 mg (800 mg Oral Given 04/15/16 2247)    New Prescriptions   ACETAMINOPHEN (TYLENOL) 325 MG TABLET    Take 2 tablets (650 mg total) by mouth every 6 (six) hours as needed.   NAPROXEN (NAPROSYN) 500 MG TABLET    Take 1 tablet (500 mg total) by mouth 2 (two) times daily with a meal.    Final diagnoses:  Viral syndrome  Sore throat (viral)   This is a 25 y.o. male with a SHx including tonsillectomy who presents to the Emergency Department complaining of constant, moderate, left sided sore throat x 4 days. [facility] at [facility] 04/13/2016 for sore throat, had a strep test performed which was negative, was dx with pharyngitis and treated in the ED with steroids, NSAIDs, pain medication and PCN IM. Pt states that he felt better the next day before his symptoms returned. He now reports associated, moderate, fever (102.2  F in triage), sore throat and diffuse body aches.  On arrival to the emergency room and the patient is a temperature of 102.2. On exam the patient is nontoxic appearing. Patient's throat is clear. Uvula is midline without edema. Tonsils are surgically absent. No peritonsillar abscess. No trismus. No drooling. Patient is tolerating by mouth without difficulty. Lungs are clear to auscultation bilaterally. Abdomen is soft nontender to palpation.  Patient was seen 2 days ago and had a negative rapid strep and negative strep culture. Despite his negative rapid strep test he received penicillin GIM as well as Decadron. As this is not resolved his sore throat I suspect that this is a viral pharyngitis and not bacterial. Will check basic blood work and a Monospot as the patient has returned with still high fevers. Patient's fever improved with Tylenol and ibuprofen in the emergency department. Patient's mono test is negative. CBC and BMP are unremarkable. No  leukocytosis. Patient is tolerating by mouth prior to discharge. I suspect viral syndrome. I encouraged the patient take Tylenol and naproxen for pain and body aches. I encouraged him to push oral fluids. Advised if he continues to have high fevers 48 hours for nitrates return to the emergency room for reevaluation. I advised the patient to follow-up with their primary care provider this week. I advised the patient to return to the emergency department with new or worsening symptoms or new concerns. The patient verbalized understanding and agreement with plan.    I personally performed the services described in this documentation, which was scribed in my presence. The recorded information has been reviewed and is accurate.       Everlene Farrier, PA-C 04/16/16 4098  Vanetta Mulders, MD 04/19/16 2207

## 2016-04-15 NOTE — ED Notes (Signed)
Pt states he was seen on Tuesday and dx with pharyngitis; pt states he felt better for one day and then symptoms returned; pt states he has been running fevers and tired; Pt states he has been take motrin with no relief; Pt states pain at 8/10 for throat and generalized body aches

## 2016-04-16 LAB — CULTURE, GROUP A STREP (THRC)

## 2016-04-16 MED ORDER — NAPROXEN 500 MG PO TABS: 500.0000 mg | ORAL_TABLET | Freq: Two times a day (BID) | ORAL | Status: DC

## 2016-04-16 MED ORDER — ACETAMINOPHEN 325 MG PO TABS: 650.0000 mg | ORAL_TABLET | Freq: Four times a day (QID) | ORAL | Status: AC | PRN

## 2016-04-16 NOTE — Discharge Instructions (Signed)
Pharyngitis Pharyngitis is redness, pain, and swelling (inflammation) of your pharynx.  CAUSES  Pharyngitis is usually caused by infection. Most of the time, these infections are from viruses (viral) and are part of a cold. However, sometimes pharyngitis is caused by bacteria (bacterial). Pharyngitis can also be caused by allergies. Viral pharyngitis may be spread from person to person by coughing, sneezing, and personal items or utensils (cups, forks, spoons, toothbrushes). Bacterial pharyngitis may be spread from person to person by more intimate contact, such as kissing.  SIGNS AND SYMPTOMS  Symptoms of pharyngitis include:   Sore throat.   Tiredness (fatigue).   Low-grade fever.   Headache.  Joint pain and muscle aches.  Skin rashes.  Swollen lymph nodes.  Plaque-like film on throat or tonsils (often seen with bacterial pharyngitis). DIAGNOSIS  Your health care provider will ask you questions about your illness and your symptoms. Your medical history, along with a physical exam, is often all that is needed to diagnose pharyngitis. Sometimes, a rapid strep test is done. Other lab tests may also be done, depending on the suspected cause.  TREATMENT  Viral pharyngitis will usually get better in 3-4 days without the use of medicine. Bacterial pharyngitis is treated with medicines that kill germs (antibiotics).  HOME CARE INSTRUCTIONS   Drink enough water and fluids to keep your urine clear or pale yellow.   Only take over-the-counter or prescription medicines as directed by your health care provider:   If you are prescribed antibiotics, make sure you finish them even if you start to feel better.   Do not take aspirin.   Get lots of rest.   Gargle with 8 oz of salt water ( tsp of salt per 1 qt of water) as often as every 1-2 hours to soothe your throat.   Throat lozenges (if you are not at risk for choking) or sprays may be used to soothe your throat. SEEK MEDICAL  CARE IF:   You have large, tender lumps in your neck.  You have a rash.  You cough up green, yellow-Wilz, or bloody spit. SEEK IMMEDIATE MEDICAL CARE IF:   Your neck becomes stiff.  You drool or are unable to swallow liquids.  You vomit or are unable to keep medicines or liquids down.  You have severe pain that does not go away with the use of recommended medicines.  You have trouble breathing (not caused by a stuffy nose). MAKE SURE YOU:   Understand these instructions.  Will watch your condition.  Will get help right away if you are not doing well or get worse.   This information is not intended to replace advice given to you by your health care provider. Make sure you discuss any questions you have with your health care provider.   Document Released: 10/04/2005 Document Revised: 07/25/2013 Document Reviewed: 06/11/2013 Elsevier Interactive Patient Education 2016 Elsevier Inc. Viral Infections A viral infection can be caused by different types of viruses.Most viral infections are not serious and resolve on their own. However, some infections may cause severe symptoms and may lead to further complications. SYMPTOMS Viruses can frequently cause:  Minor sore throat.  Aches and pains.  Headaches.  Runny nose.  Different types of rashes.  Watery eyes.  Tiredness.  Cough.  Loss of appetite.  Gastrointestinal infections, resulting in nausea, vomiting, and diarrhea. These symptoms do not respond to antibiotics because the infection is not caused by bacteria. However, you might catch a bacterial infection following the viral infection.  infection. This is sometimes called a "superinfection." Symptoms of such a bacterial infection may include: °· Worsening sore throat with pus and difficulty swallowing. °· Swollen neck glands. °· Chills and a high or persistent fever. °· Severe headache. °· Tenderness over the sinuses. °· Persistent overall ill feeling (malaise), muscle aches,  and tiredness (fatigue). °· Persistent cough. °· Yellow, green, or Liou mucus production with coughing. °HOME CARE INSTRUCTIONS  °· Only take over-the-counter or prescription medicines for pain, discomfort, diarrhea, or fever as directed by your caregiver. °· Drink enough water and fluids to keep your urine clear or pale yellow. Sports drinks can provide valuable electrolytes, sugars, and hydration. °· Get plenty of rest and maintain proper nutrition. Soups and broths with crackers or rice are fine. °SEEK IMMEDIATE MEDICAL CARE IF:  °· You have severe headaches, shortness of breath, chest pain, neck pain, or an unusual rash. °· You have uncontrolled vomiting, diarrhea, or you are unable to keep down fluids. °· You or your child has an oral temperature above 102° F (38.9° C), not controlled by medicine. °· Your baby is older than 3 months with a rectal temperature of 102° F (38.9° C) or higher. °· Your baby is 3 months old or younger with a rectal temperature of 100.4° F (38° C) or higher. °MAKE SURE YOU:  °· Understand these instructions. °· Will watch your condition. °· Will get help right away if you are not doing well or get worse. °  °This information is not intended to replace advice given to you by your health care provider. Make sure you discuss any questions you have with your health care provider. °  °Document Released: 07/14/2005 Document Revised: 12/27/2011 Document Reviewed: 03/12/2015 °Elsevier Interactive Patient Education ©2016 Elsevier Inc. ° °

## 2017-02-16 ENCOUNTER — Emergency Department (HOSPITAL_COMMUNITY): Payer: Self-pay

## 2017-02-16 ENCOUNTER — Emergency Department (HOSPITAL_COMMUNITY)
Admission: EM | Admit: 2017-02-16 | Discharge: 2017-02-16 | Disposition: A | Discharge status: Home / Self Care | Payer: Self-pay | Attending: Emergency Medicine | Admitting: Emergency Medicine

## 2017-02-16 ENCOUNTER — Encounter (HOSPITAL_COMMUNITY): Payer: Self-pay | Admitting: *Deleted

## 2017-02-16 DIAGNOSIS — S39012A Strain of muscle, fascia and tendon of lower back, initial encounter: Secondary | ICD-10-CM | POA: Insufficient documentation

## 2017-02-16 DIAGNOSIS — Y939 Activity, unspecified: Secondary | ICD-10-CM | POA: Insufficient documentation

## 2017-02-16 DIAGNOSIS — Y999 Unspecified external cause status: Secondary | ICD-10-CM | POA: Insufficient documentation

## 2017-02-16 DIAGNOSIS — Z79899 Other long term (current) drug therapy: Secondary | ICD-10-CM | POA: Insufficient documentation

## 2017-02-16 DIAGNOSIS — Y929 Unspecified place or not applicable: Secondary | ICD-10-CM | POA: Insufficient documentation

## 2017-02-16 DIAGNOSIS — X58XXXA Exposure to other specified factors, initial encounter: Secondary | ICD-10-CM | POA: Insufficient documentation

## 2017-02-16 MED ORDER — KETOROLAC TROMETHAMINE 30 MG/ML IJ SOLN
30.0000 mg | Freq: Once | INTRAMUSCULAR | Status: AC
  Administered 2017-02-16: 30 mg via INTRAVENOUS
  Filled 2017-02-16: qty 1

## 2017-02-16 MED ORDER — NAPROXEN 375 MG PO TABS: 375.0000 mg | ORAL_TABLET | Freq: Two times a day (BID) | ORAL | 0 refills | Status: AC

## 2017-02-16 MED ORDER — METHOCARBAMOL 500 MG PO TABS: 500.0000 mg | ORAL_TABLET | Freq: Two times a day (BID) | ORAL | 0 refills | Status: AC

## 2017-02-16 NOTE — ED Notes (Signed)
States has been working out vigorously recently. Started 3 days ago with LBP, "spasms". Has not taken any Ibuprofen. Ambulates without difficulty.

## 2017-02-16 NOTE — ED Triage Notes (Signed)
Pt reports lower back pain for several days, radiates up his back. Denies injury. Ambulatory at triage.

## 2017-02-16 NOTE — Discharge Instructions (Signed)
X-rays were normal. Is likely a musculoskeletal sprain. Heat to the affected area. Warm soaks in Epsom salt. Take naproxen for pain. Do not take any extra Aleve, Motrin, ibuprofen, Advil this medication. May take Tylenol. Use Robaxin for muscle relaxation. This medication will make you drowsy so do not drive with it. Follow-up with orthopedics if symptoms are not improving. Return to ED if you develop any numbness in her groins, loss of bowel or bladder, unable to urinate, fevers or for any other reason.

## 2017-02-16 NOTE — ED Provider Notes (Signed)
[facility]-EMERGENCY DEPT Provider Note   CSN: [id number] Arrival date & time: 02/16/17  1111  By signing my name below, I, Phillips Climes, attest that this documentation has been prepared under the direction and in the presence of Rise Mu, PA-C.  Electronically Signed: Phillips Climes, Scribe. 02/16/2017. 12:53 PM.   History   Chief Complaint Chief Complaint  Patient presents with  . Back Pain   Alexander Mayer is a 26 y.o. male with no pertinent PMHx who presents to the Emergency Department with complaints of intermittent lower back spasms with pain x3 days. No recent injury or trauma. States that he has been running more recently. Pt is able to ambulate normally, without complaint or assistance.   Pt denies experiencing any other acute sx, including bowel or bladder incontience, numbness, weakness, nausea, vomiting, abdominal pain or fevers.   No reported hx of IV drug use or cancer.   Pt has not attempted any kind of OTC symptomatic management.   The history is provided by the patient. No language interpreter was used.   Past Medical History:  Diagnosis Date  . Wears glasses    There are no active problems to display for this patient.  Past Surgical History:  Procedure Laterality Date  . TONSILLECTOMY      Home Medications    Prior to Admission medications   Medication Sig Start Date End Date Taking? Authorizing Provider  acetaminophen (TYLENOL) 325 MG tablet Take 2 tablets (650 mg total) by mouth every 6 (six) hours as needed. 04/16/16   Everlene Farrier, PA-C  ibuprofen (ADVIL,MOTRIN) 200 MG tablet Take 200 mg by mouth every 6 (six) hours as needed for moderate pain.    Historical Provider, MD  naproxen (NAPROSYN) 500 MG tablet Take 1 tablet (500 mg total) by mouth 2 (two) times daily with a meal. 04/16/16   Everlene Farrier, PA-C  traMADol (ULTRAM) 50 MG tablet Take 1 tablet (50 mg total) by mouth every 6 (six) hours as needed. Patient not taking: Reported on  04/15/2016 02/27/15   Gilda Crease, MD    Family History Family History  Problem Relation Age of Onset  . Cancer Maternal Aunt     breast  . Diabetes Maternal Uncle   . Stroke Maternal Aunt   . Heart disease Neg Hx   . Hypertension Neg Hx     Social History Social History  Substance Use Topics  . Smoking status: Never Smoker  . Smokeless tobacco: Never Used  . Alcohol use No     Allergies   Patient has no known allergies.   Review of Systems Review of Systems  Constitutional: Negative for fever.  Gastrointestinal: Negative for abdominal pain, nausea and vomiting.  Genitourinary: Negative for difficulty urinating.  Musculoskeletal: Positive for back pain.  Skin: Negative for color change.  Neurological: Negative for weakness and numbness.    Physical Exam Updated Vital Signs BP 140/83 (BP Location: Left Arm)   Pulse 62   Temp 98.3 F (36.8 C) (Oral)   Resp 18   SpO2 100%   Physical Exam  Constitutional: He is oriented to person, place, and time. He appears well-developed and well-nourished. No distress.  HENT:  Head: Normocephalic and atraumatic.  Cardiovascular: Normal rate.   Pulmonary/Chest: Effort normal.  Musculoskeletal: Normal range of motion.  Midline L-spine tenderness. No deformities or step-offs. FROM. Right sided paraspinal tenderness with tense musculature. Strength 5/5 in BLE. Normal reflexes. Capillary refill intact. Sensation intact to sharp/ dull.  Able to ambulate with normal gait.  Neurological: He is alert and oriented to person, place, and time.  Skin: Skin is warm and dry. Capillary refill takes less than 2 seconds.  Psychiatric: He has a normal mood and affect.  Nursing note and vitals reviewed.  ED Treatments / Results  DIAGNOSTIC STUDIES: Oxygen Saturation is 100% on RA, nl by my interpretation.    COORDINATION OF CARE: 12:23 PM Discussed treatment plan with pt at bedside and pt agreed to plan. Pt verbalized understanding.     12:55 PM Discussed imaging results with pt. He agrees to follow-up with PCP or orthopedics should sx persist. Strict return precautions given. Pt verbalized understanding.  Labs (all labs ordered are listed, but only abnormal results are displayed) Labs Reviewed - No data to display  EKG  EKG Interpretation None      Radiology Dg Lumbar Spine Complete  Result Date: 02/16/2017 CLINICAL DATA:  Low back pain and spasms for 3 days. No known injury. EXAM: LUMBAR SPINE - COMPLETE 4+ VIEW COMPARISON:  CT abdomen and pelvis 05/06/2013. FINDINGS: There is no evidence of lumbar spine fracture. Alignment is normal. Straightening of lordosis is unchanged. Intervertebral disc spaces are maintained. IMPRESSION: Negative exam. Electronically Signed   By: Drusilla Kanner M.D.   On: 02/16/2017 12:47   Procedures Procedures (including critical care time)  Medications Ordered in ED Medications  ketorolac (TORADOL) 30 MG/ML injection 30 mg (30 mg Intravenous Given 02/16/17 1232)   Initial Impression / Assessment and Plan / ED Course  I have reviewed the triage vital signs and the nursing notes.  Pertinent labs & imaging results that were available during my care of the patient were reviewed by me and considered in my medical decision making (see chart for details).     Patient with back pain.  No neurological deficits and normal neuro exam.  Patient can walk but states is painful.  No loss of bowel or bladder control.  No concern for cauda equina.  No fever, night sweats, weight loss, h/o cancer, IVDU.  RICE protocol and pain medicine indicated and discussed with patient.    Final Clinical Impressions(s) / ED Diagnoses   Final diagnoses:  Strain of lumbar region, initial encounter   New Prescriptions Discharge Medication List as of 02/16/2017  1:02 PM    START taking these medications   Details  methocarbamol (ROBAXIN) 500 MG tablet Take 1 tablet (500 mg total) by mouth 2 (two) times daily.,  Starting Wed 02/16/2017, Print       I personally performed the services described in this documentation, which was scribed in my presence. The recorded information has been reviewed and is accurate.     Rise Mu, PA-C 02/16/17 1338    Azalia Bilis, MD 02/16/17 1539

## 2018-01-22 IMAGING — CR DG LUMBAR SPINE COMPLETE 4+V
5 series · 5 of 5 positions shown · non-contrast
Comparison: CT abdomen and pelvis 05/06/2013.

CLINICAL DATA: Low back pain and spasms for 3 days. No known
injury.

EXAM:
LUMBAR SPINE - COMPLETE 4+ VIEW

[l-spine ap]
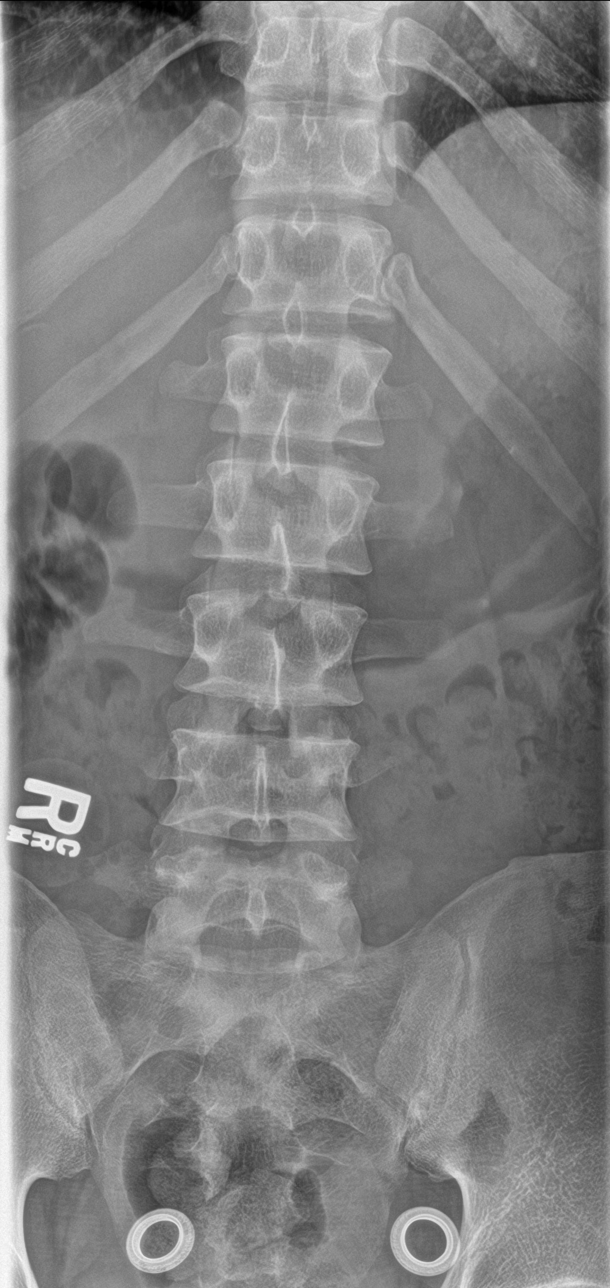

[l-spine obl (1 of 2)]
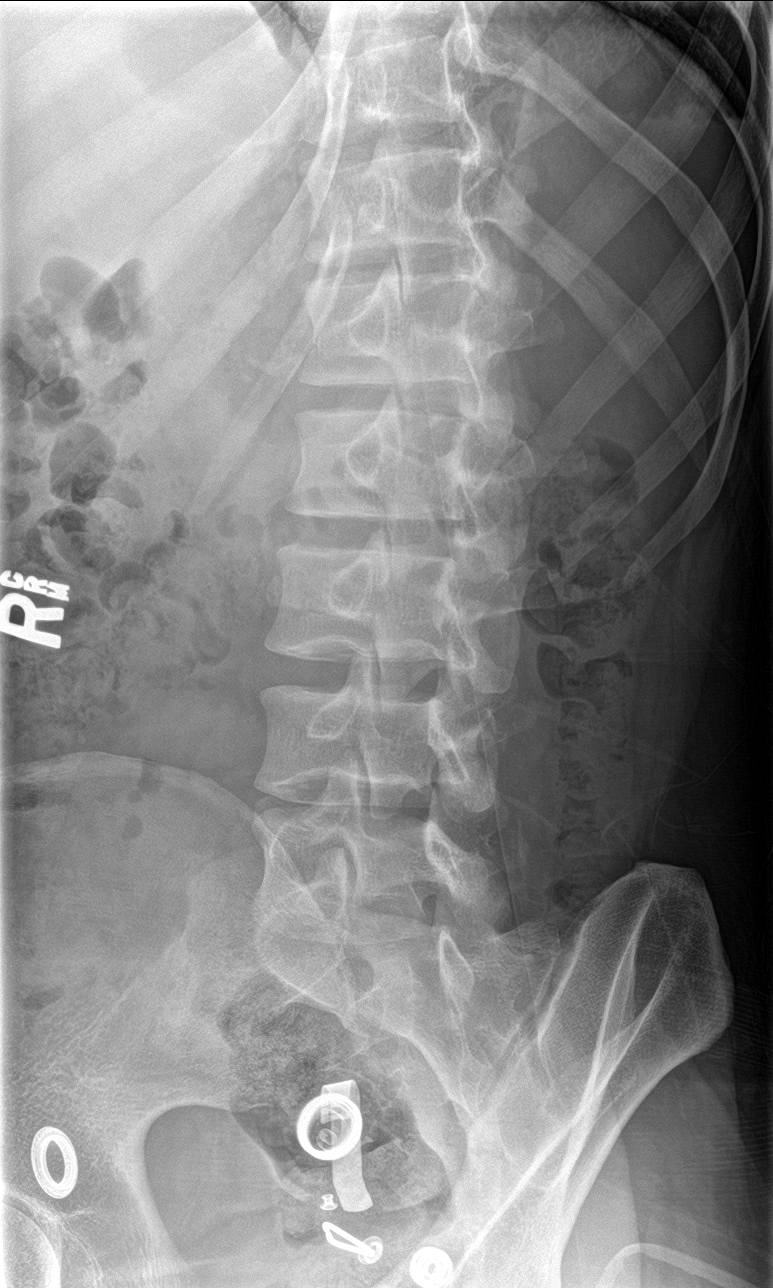

[l-spine obl (2 of 2)]
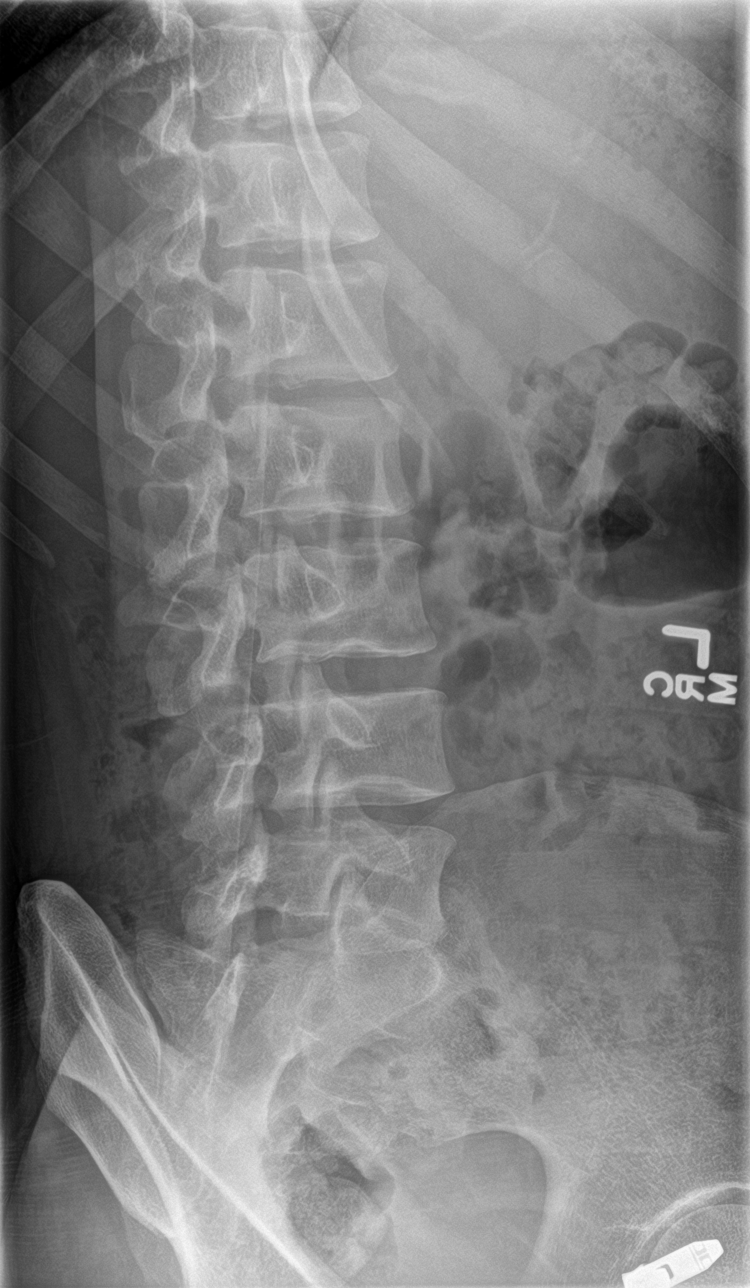

[l-spine lat]
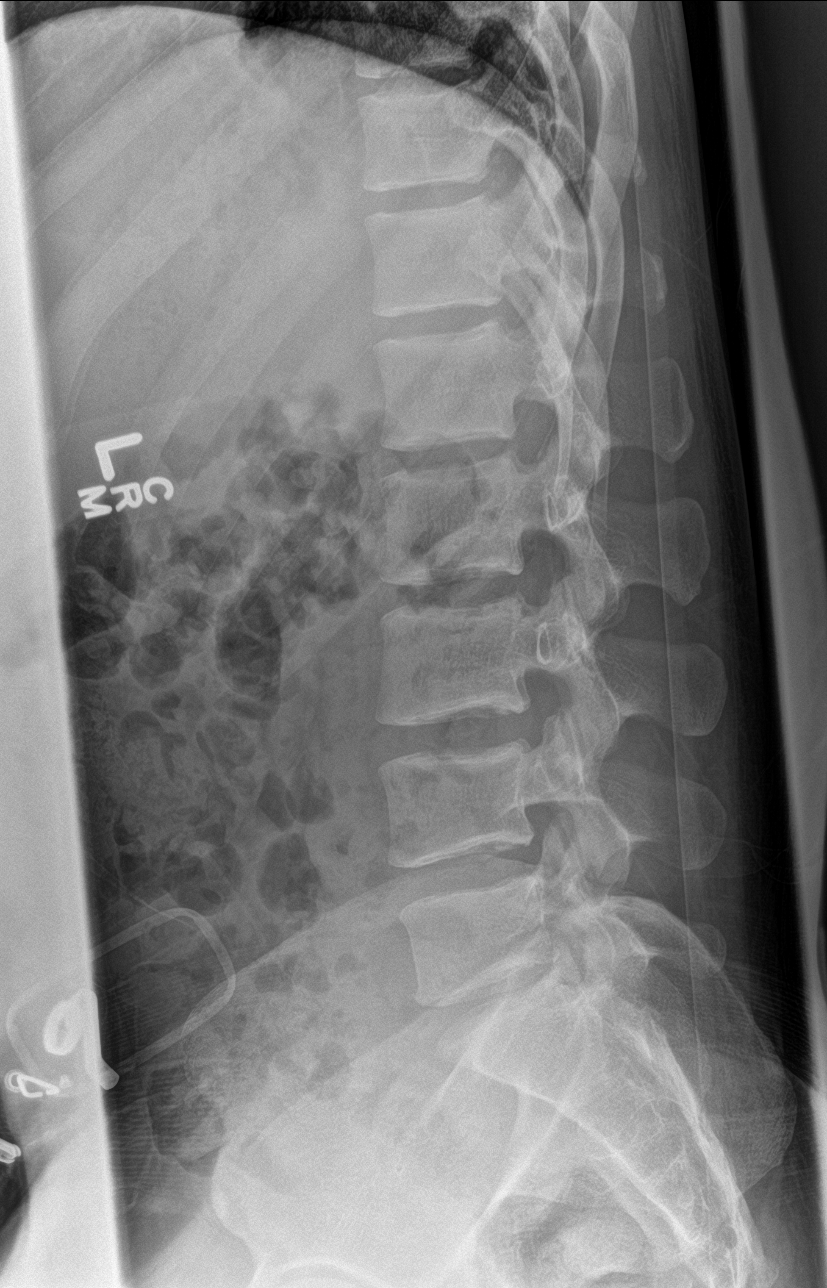

[l-spine spot]
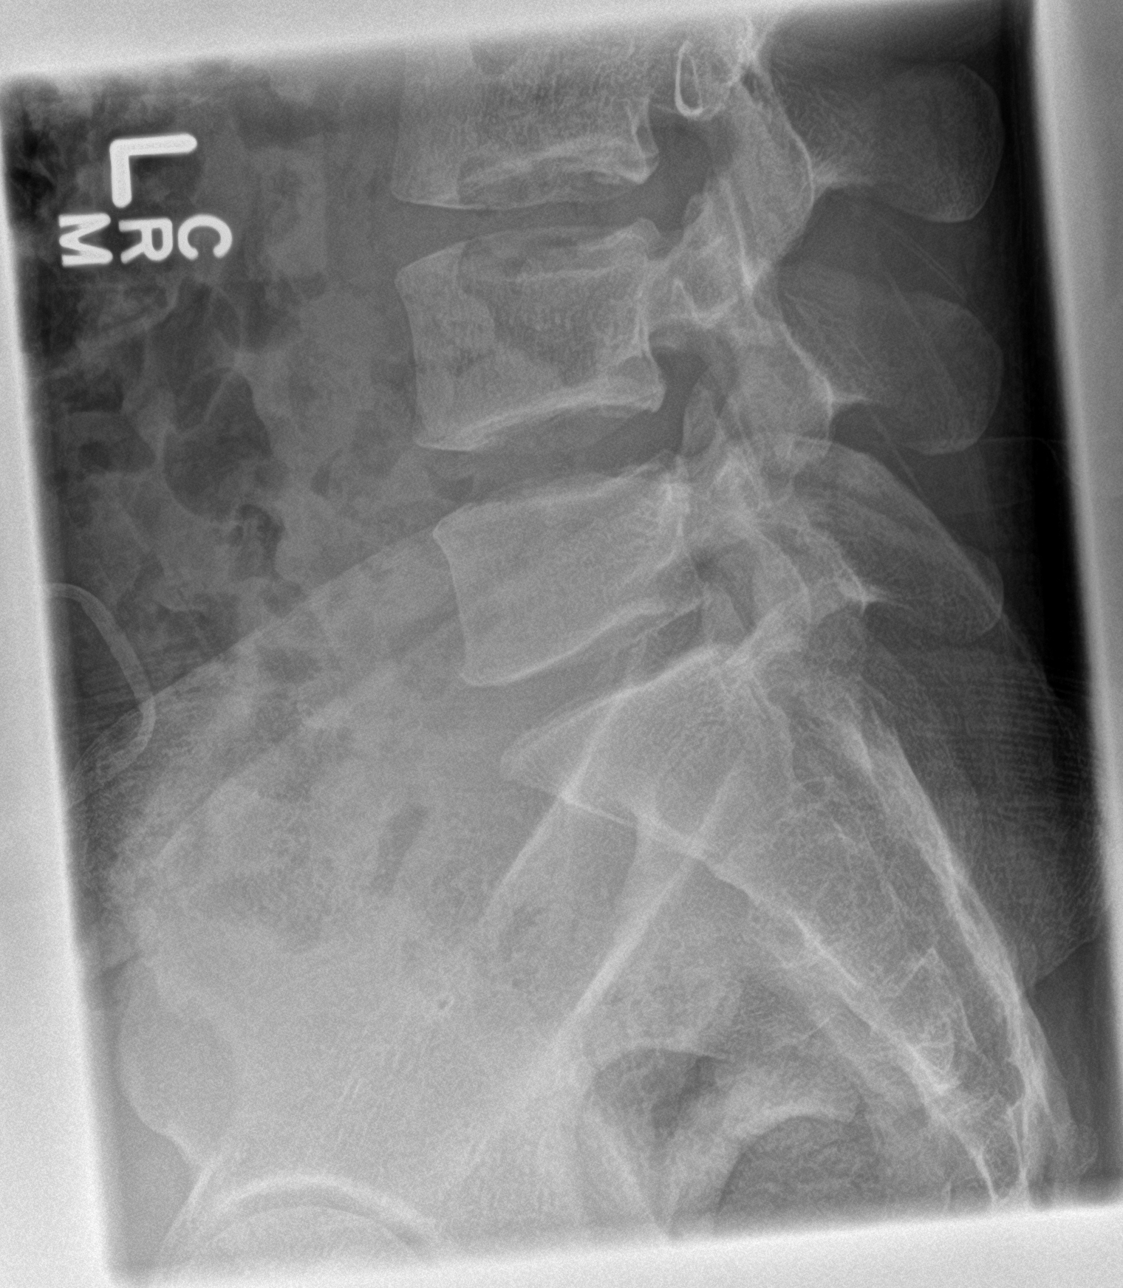

[5 of 5 positions shown; findings below may reference images not displayed]

FINDINGS: There is no evidence of lumbar spine fracture. Alignment is normal.
Straightening of lordosis is unchanged. Intervertebral disc spaces
are maintained.
IMPRESSION: Negative exam.

## 2019-10-17 ENCOUNTER — Emergency Department (HOSPITAL_COMMUNITY)
Admission: EM | Admit: 2019-10-17 | Discharge: 2019-10-17 | Disposition: A | Discharge status: Home / Self Care | Payer: 59 | Attending: Emergency Medicine | Admitting: Emergency Medicine

## 2019-10-17 ENCOUNTER — Encounter (HOSPITAL_COMMUNITY): Payer: Self-pay | Admitting: Emergency Medicine

## 2019-10-17 DIAGNOSIS — U071 COVID-19: Secondary | ICD-10-CM | POA: Diagnosis not present

## 2019-10-17 DIAGNOSIS — J029 Acute pharyngitis, unspecified: Secondary | ICD-10-CM | POA: Insufficient documentation

## 2019-10-17 DIAGNOSIS — R070 Pain in throat: Secondary | ICD-10-CM | POA: Diagnosis present

## 2019-10-17 LAB — GROUP A STREP BY PCR: Group A Strep by PCR: NOT DETECTED

## 2019-10-17 MED ORDER — DEXAMETHASONE SODIUM PHOSPHATE 4 MG/ML IJ SOLN
4.0000 mg | Freq: Once | INTRAMUSCULAR | Status: AC
  Administered 2019-10-17: 17:00:00 4 mg via INTRAMUSCULAR
  Filled 2019-10-17: qty 1

## 2019-10-17 MED ORDER — ACETAMINOPHEN 325 MG PO TABS
650.0000 mg | ORAL_TABLET | Freq: Once | ORAL | Status: AC
  Administered 2019-10-17: 17:00:00 650 mg via ORAL
  Filled 2019-10-17: qty 2

## 2019-10-17 NOTE — Discharge Instructions (Addendum)
Please take time ibuprofen for sore throat.  Viral Illness TREATMENT  Treatment is directed at relieving symptoms. There is no cure. Antibiotics are not effective, because the infection is caused by a virus, not by bacteria. Treatment may include:  Increased fluid intake. Sports drinks offer valuable electrolytes, sugars, and fluids.  Breathing heated mist or steam (vaporizer or shower).  Eating chicken soup or other clear broths, and maintaining good nutrition.  Getting plenty of rest.  Using gargles or lozenges for comfort.  Increasing usage of your inhaler if you have asthma.  Return to work when your temperature has returned to normal.  Gargle warm salt water and spit it out for sore throat. Take benadryl to decrease sinus secretions. Continue to alternate between Tylenol and ibuprofen for pain and fever control.  Follow Up: Follow up with your primary care doctor in 5-7 days for recheck of ongoing symptoms.  Return to emergency department for emergent changing or worsening of symptoms.

## 2019-10-17 NOTE — ED Provider Notes (Addendum)
[facility]   CSN: [id number] Arrival date & time: 10/17/19  1449     History Chief Complaint  Patient presents with  . COVID +  . Sore Throat    Alexander SneddonMarkee I Mayer is a 28 y.o. male.  No language interpreter was used.  Sore Throat This is a new problem. The current episode started more than 2 days ago. The problem occurs constantly. The problem has not changed since onset.Pertinent negatives include no chest pain, no abdominal pain, no headaches and no shortness of breath. The symptoms are aggravated by eating and swallowing. He has tried nothing for the symptoms.    Patient states he has had a sore throat for 3 days.  States he tested positive for Covid and got his results today.  States he is concerned that he has strep throat.  Patient has no history of strep throat in the past however states that he feels that it is like swallowing razor blades when he swallows.  He is having some difficulty swallowing food but no difficulty drinking water.  States he is able to breathe without difficulty denies any hoarse voice.  Denies any cough or congestion.  Denies any tongue pain or swelling.  Denies any fevers or shortness of breath.  Denies any history of diabetes steroid use or HIV.     Past Medical History:  Diagnosis Date  . Wears glasses     There are no problems to display for this patient.   Past Surgical History:  Procedure Laterality Date  . TONSILLECTOMY         Family History  Problem Relation Age of Onset  . Cancer Maternal Aunt        breast  . Diabetes Maternal Uncle   . Stroke Maternal Aunt   . Heart disease Neg Hx   . Hypertension Neg Hx     Social History   Tobacco Use  . Smoking status: Never Smoker  . Smokeless tobacco: Never Used  Substance Use Topics  . Alcohol use: No  . Drug use: No    Home Medications Prior to Admission medications   Medication Sig Start Date End Date Taking? Authorizing  Provider  acetaminophen (TYLENOL) 325 MG tablet Take 2 tablets (650 mg total) by mouth every 6 (six) hours as needed. 04/16/16   Everlene Farrieransie, William, PA-C  ibuprofen (ADVIL,MOTRIN) 200 MG tablet Take 200 mg by mouth every 6 (six) hours as needed for moderate pain.    [provider]  methocarbamol (ROBAXIN) 500 MG tablet Take 1 tablet (500 mg total) by mouth 2 (two) times daily. 02/16/17   Rise MuLeaphart, Kenneth T, PA-C  naproxen (NAPROSYN) 375 MG tablet Take 1 tablet (375 mg total) by mouth 2 (two) times daily. 02/16/17   Rise MuLeaphart, Kenneth T, PA-C  traMADol (ULTRAM) 50 MG tablet Take 1 tablet (50 mg total) by mouth every 6 (six) hours as needed. Patient not taking: Reported on 04/15/2016 02/27/15   Gilda CreasePollina, Christopher J, MD    Allergies    Patient has no known allergies.  Review of Systems   Review of Systems  Constitutional: Negative for chills and fever.  HENT: Positive for sore throat. Negative for congestion.   Eyes: Negative for pain.  Respiratory: Negative for cough and shortness of breath.   Cardiovascular: Negative for chest pain and leg swelling.  Gastrointestinal: Negative for abdominal pain and vomiting.  Genitourinary: Negative for dysuria.  Musculoskeletal: Negative for myalgias.  Skin: Negative for  rash.  Neurological: Negative for dizziness and headaches.    Physical Exam Updated Vital Signs BP 126/89   Pulse 64   Temp 97.8 F (36.6 C) (Oral)   Resp 16   SpO2 99%   Physical Exam Vitals and nursing note reviewed.  Constitutional:      General: He is not in acute distress.    Appearance: Normal appearance. He is not ill-appearing.  HENT:     Head: Normocephalic and atraumatic.     Mouth/Throat:     Mouth: Mucous membranes are moist.     Pharynx: Posterior oropharyngeal erythema (Posterior pharynx erythema, mildly enlarged left tonsil) present. No oropharyngeal exudate.  Eyes:     General: No scleral icterus.       Right eye: No discharge.        Left eye: No  discharge.     Conjunctiva/sclera: Conjunctivae normal.  Neck:     Comments: Anterior bilateral cervical adenopathy that is tender Cardiovascular:     Rate and Rhythm: Normal rate.  Pulmonary:     Effort: Pulmonary effort is normal.     Breath sounds: No stridor.  Musculoskeletal:     Cervical back: Normal range of motion.  Neurological:     Mental Status: He is alert and oriented to person, place, and time. Mental status is at baseline.     ED Results / Procedures / Treatments   Labs (all labs ordered are listed, but only abnormal results are displayed) Labs Reviewed  GROUP A STREP BY PCR    EKG None  Radiology No results found.  Procedures Procedures (including critical care time)  Medications Ordered in ED Medications  dexamethasone (DECADRON) injection 4 mg (4 mg Intramuscular Given 10/17/19 1652)  acetaminophen (TYLENOL) tablet 650 mg (650 mg Oral Given 10/17/19 1652)    ED Course  I have reviewed the triage vital signs and the nursing notes.  Pertinent labs & imaging results that were available during my care of the patient were reviewed by me and considered in my medical decision making (see chart for details).    MDM Rules/Calculators/A&P                      Is Covid positive 28 year old male presented today for sore throat.  This is a sequela of Covid however he is concerned for strep.   As patient has a CENTOR score of 3 will obtain rapid strep. If negative will suggest conservative treatment of teleibuprofen.  Patient has no other symptoms of Covid at this time.  He is afebrile and well-appearing.  Does have mildly swollen right tonsil.  Will provide with single dose of Tylenol and 4 mg injection of Decadron for relief of swelling.  He is well-appearing at this time patient managing secretions without difficulty.  Strep swab is negative for strep.  Alexander Mayer was evaluated in Emergency Department on 10/18/2019 for the symptoms described in the  history of present illness. He was evaluated in the context of the global COVID-19 pandemic, which necessitated consideration that the patient might be at risk for infection with the SARS-CoV-2 virus that causes COVID-19. Institutional protocols and algorithms that pertain to the evaluation of patients at risk for COVID-19 are in a state of rapid change based on information released by regulatory bodies including the CDC and federal and state organizations. These policies and algorithms were followed during the patient's care in the ED.    Final Clinical Impression(s) / ED Diagnoses  Final diagnoses:  COVID-19  Viral pharyngitis    Rx / DC Orders ED Discharge Orders    None       Tedd Sias, [place] 10/17/19 [place], Nivia Gervase [place], [place] 10/18/19 0200    Drenda Freeze, MD 10/18/19 [phone]

## 2019-10-17 NOTE — ED Triage Notes (Signed)
Received COVID + results today.  C/o sore throat.  Denies any other symptoms at this time.
